# Patient Record
Sex: Female | Born: 1975 | Race: Black or African American | Hispanic: No | Marital: Single | Smoking: Former smoker
Health system: Southern US, Community
[De-identification: ages and names within clinical notes are randomized; demographics above are authoritative.]

## PROBLEM LIST (undated history)

## (undated) DIAGNOSIS — C801 Malignant (primary) neoplasm, unspecified: Secondary | ICD-10-CM

## (undated) DIAGNOSIS — I10 Essential (primary) hypertension: Secondary | ICD-10-CM

## (undated) DIAGNOSIS — E119 Type 2 diabetes mellitus without complications: Secondary | ICD-10-CM

---

## 2006-05-04 ENCOUNTER — Ambulatory Visit: Payer: Self-pay | Admitting: Family Medicine

## 2008-11-12 ENCOUNTER — Emergency Department: Payer: Self-pay | Admitting: Emergency Medicine

## 2010-07-02 ENCOUNTER — Encounter: Payer: Self-pay | Admitting: Obstetrics and Gynecology

## 2010-07-07 ENCOUNTER — Emergency Department: Payer: Self-pay | Admitting: Emergency Medicine

## 2010-07-11 ENCOUNTER — Ambulatory Visit: Payer: Self-pay

## 2010-07-27 ENCOUNTER — Encounter: Payer: Self-pay | Admitting: Obstetrics & Gynecology

## 2013-03-15 ENCOUNTER — Ambulatory Visit: Payer: Self-pay | Admitting: Physical Medicine and Rehabilitation

## 2014-06-15 ENCOUNTER — Emergency Department: Payer: Self-pay | Admitting: Emergency Medicine

## 2014-06-15 LAB — CBC WITH DIFFERENTIAL/PLATELET
Basophil #: 0.1 10*3/uL (ref 0.0–0.1)
Basophil %: 0.7 %
EOS ABS: 0.1 10*3/uL (ref 0.0–0.7)
Eosinophil %: 0.7 %
HCT: 36.8 % (ref 35.0–47.0)
HGB: 12.3 g/dL (ref 12.0–16.0)
LYMPHS PCT: 20.5 %
Lymphocyte #: 1.5 10*3/uL (ref 1.0–3.6)
MCH: 29.6 pg (ref 26.0–34.0)
MCHC: 33.5 g/dL (ref 32.0–36.0)
MCV: 89 fL (ref 80–100)
Monocyte #: 0.5 x10 3/mm (ref 0.2–0.9)
Monocyte %: 6.7 %
NEUTROS PCT: 71.4 %
Neutrophil #: 5.3 10*3/uL (ref 1.4–6.5)
PLATELETS: 308 10*3/uL (ref 150–440)
RBC: 4.16 10*6/uL (ref 3.80–5.20)
RDW: 14 % (ref 11.5–14.5)
WBC: 7.5 10*3/uL (ref 3.6–11.0)

## 2014-06-15 LAB — URINALYSIS, COMPLETE
Specific Gravity: 1.023 (ref 1.003–1.030)
Squamous Epithelial: 2

## 2014-06-15 LAB — COMPREHENSIVE METABOLIC PANEL
ALBUMIN: 4 g/dL (ref 3.4–5.0)
ALK PHOS: 64 U/L (ref 46–116)
ALT: 29 U/L (ref 14–63)
AST: 14 U/L — AB (ref 15–37)
Anion Gap: 10 (ref 7–16)
BUN: 11 mg/dL (ref 7–18)
Bilirubin,Total: 0.4 mg/dL (ref 0.2–1.0)
CALCIUM: 9 mg/dL (ref 8.5–10.1)
CO2: 25 mmol/L (ref 21–32)
Chloride: 105 mmol/L (ref 98–107)
Creatinine: 0.93 mg/dL (ref 0.60–1.30)
EGFR (African American): >60
Glucose: 265 mg/dL — ABNORMAL HIGH (ref 65–99)
OSMOLALITY: 288 (ref 275–301)
Potassium: 3.5 mmol/L (ref 3.5–5.1)
SODIUM: 140 mmol/L (ref 136–145)
Total Protein: 7.7 g/dL (ref 6.4–8.2)

## 2014-06-15 LAB — LIPASE, BLOOD: Lipase: 92 U/L (ref 73–393)

## 2014-06-17 LAB — URINE CULTURE

## 2014-07-02 ENCOUNTER — Ambulatory Visit: Payer: Self-pay

## 2014-09-08 HISTORY — PX: NEPHRECTOMY: SHX65

## 2014-12-13 ENCOUNTER — Other Ambulatory Visit: Payer: Self-pay | Admitting: Family Medicine

## 2014-12-13 DIAGNOSIS — Z1231 Encounter for screening mammogram for malignant neoplasm of breast: Secondary | ICD-10-CM

## 2014-12-26 ENCOUNTER — Ambulatory Visit
Admission: RE | Admit: 2014-12-26 | Discharge: 2014-12-26 | Disposition: A | Discharge status: Home / Self Care | Payer: PRIVATE HEALTH INSURANCE | Source: Ambulatory Visit | Attending: Family Medicine | Admitting: Family Medicine

## 2014-12-26 DIAGNOSIS — Z1231 Encounter for screening mammogram for malignant neoplasm of breast: Secondary | ICD-10-CM | POA: Diagnosis present

## 2014-12-26 HISTORY — DX: Malignant (primary) neoplasm, unspecified: C80.1

## 2015-09-08 IMAGING — CT CT ABDOMEN AND PELVIS WITHOUT AND WITH CONTRAST
2 of 7 series · 13 of 46 positions shown, 18 images · IV contrast (omnipaque)
Comparison: 06/15/2014

CLINICAL DATA: Left renal mass

EXAM:
CT ABDOMEN AND PELVIS WITHOUT AND WITH CONTRAST
TECHNIQUE: Multidetector CT imaging of the abdomen and pelvis was performed
following the standard protocol before and following the bolus
administration of intravenous contrast.
CONTRAST:  150 cc Omnipaque 350

[Series 13: hematuria < 45 wwith · axial · 0.91mm/px · z∈[-1008,-613]mm · 10 of 95 slices shown, 15 images]
[im 8/95  soft-tissue]
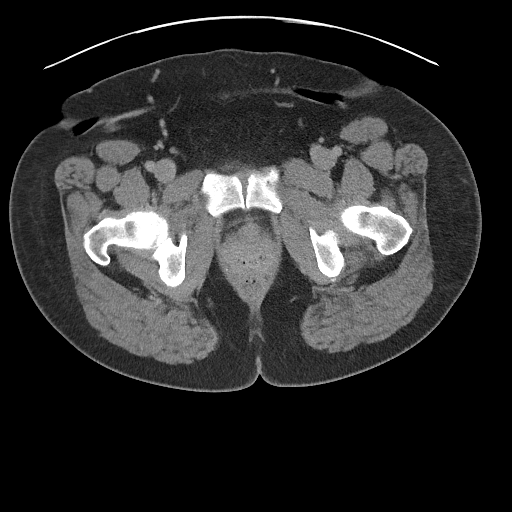
[im 8/95  bone]
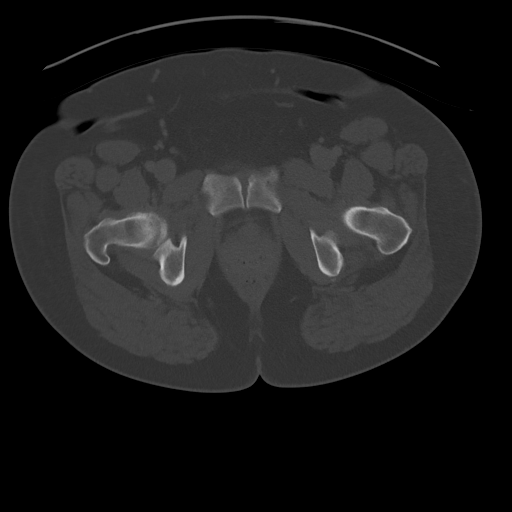
[im 22/95  soft-tissue]
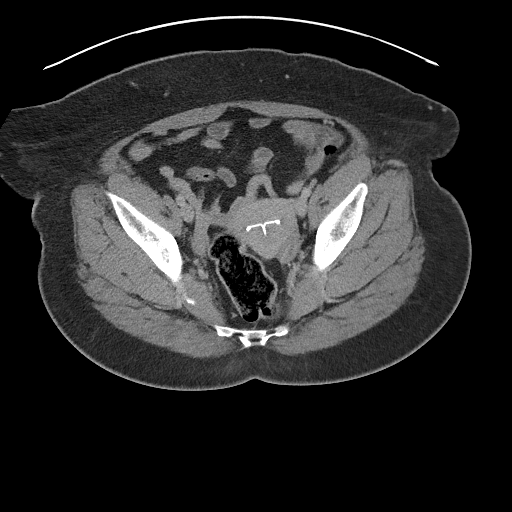
[im 29/95  soft-tissue]
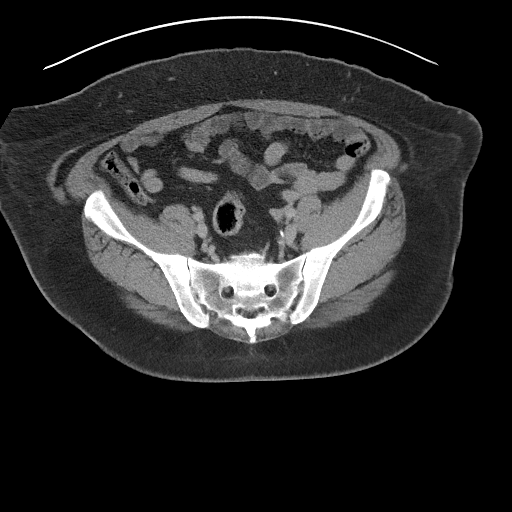
[im 37/95  soft-tissue]
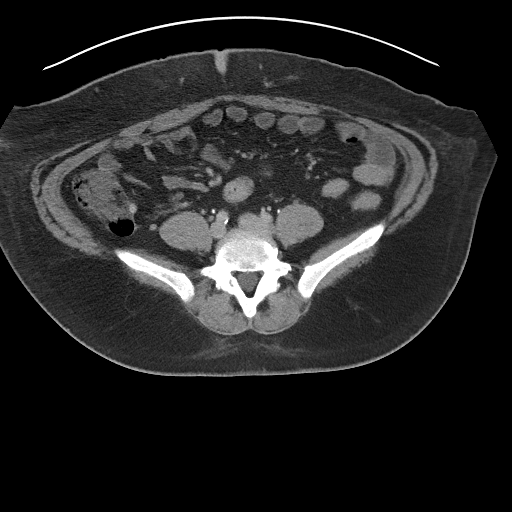
[im 51/95  soft-tissue]
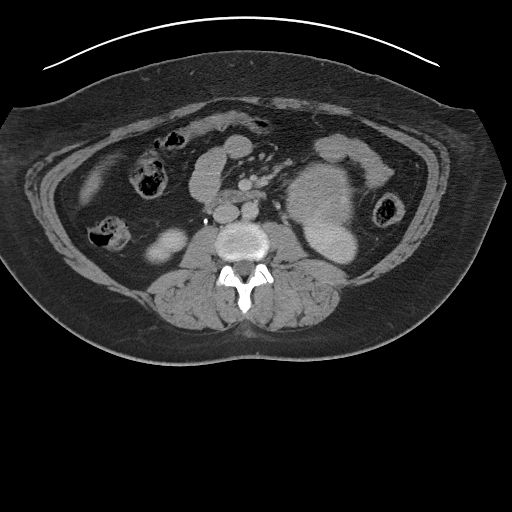
[im 58/95  soft-tissue]
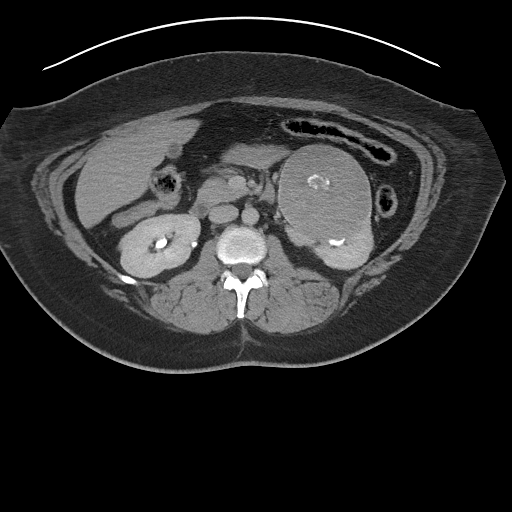
[im 66/95  soft-tissue]
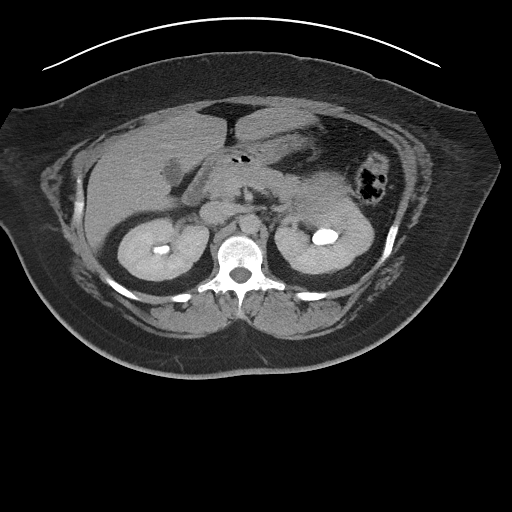
[im 66/95  lung]
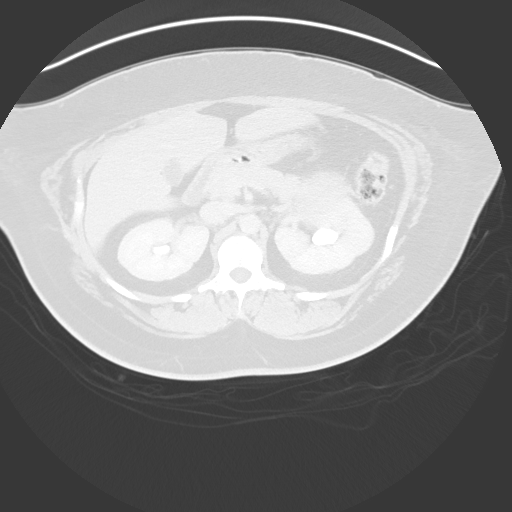
[im 73/95  lung]
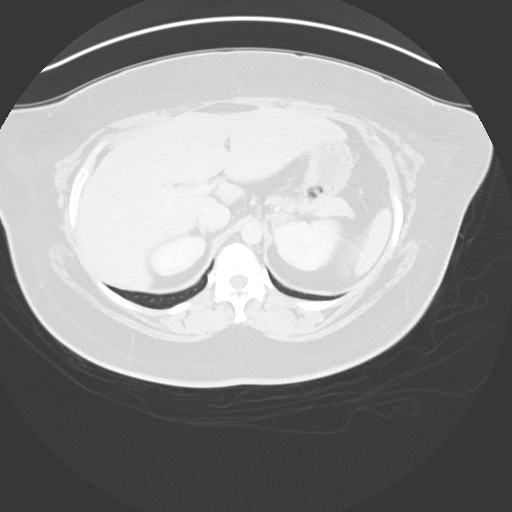
[im 80/95  soft-tissue]
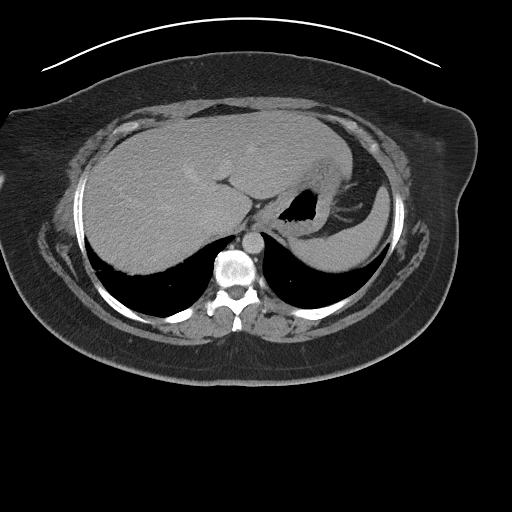
[im 80/95  lung]
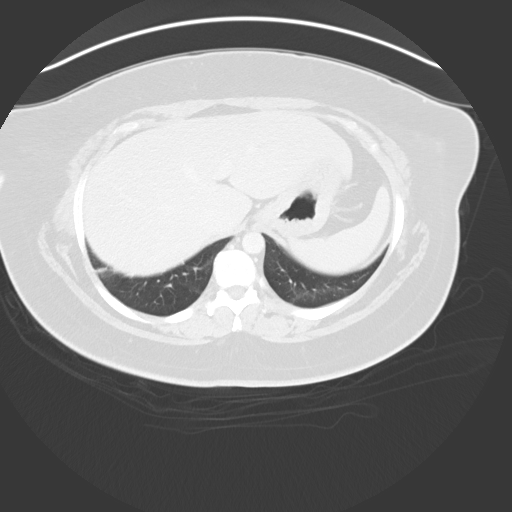
[im 87/95  soft-tissue]
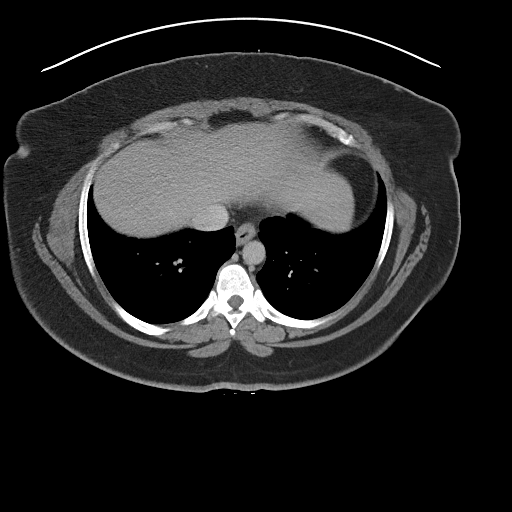
[im 87/95  lung]
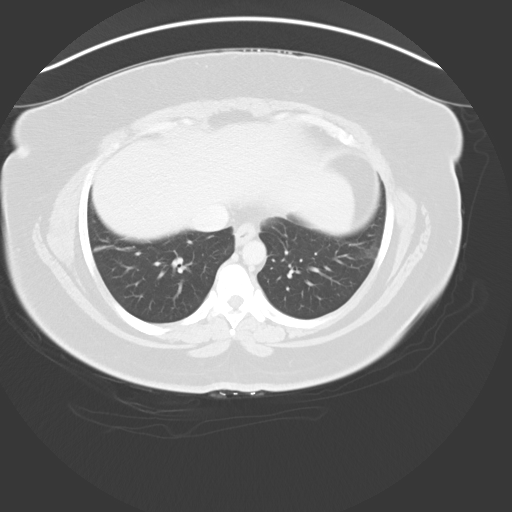
[im 87/95  bone]
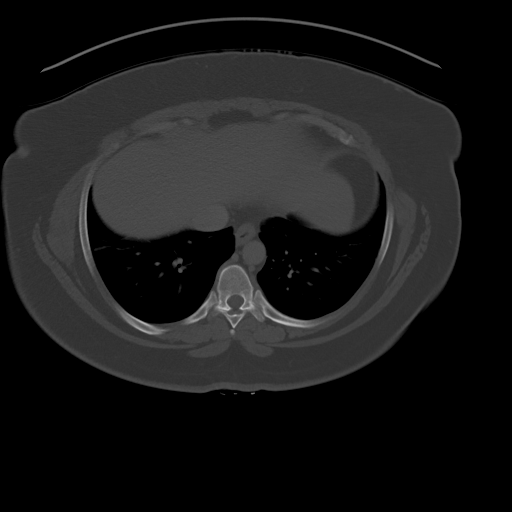

[Series 17: cor hematuria < 45 w. · coronal · 0.86mm/px · 3 of 149 slices shown]
[im 38/149  soft-tissue]
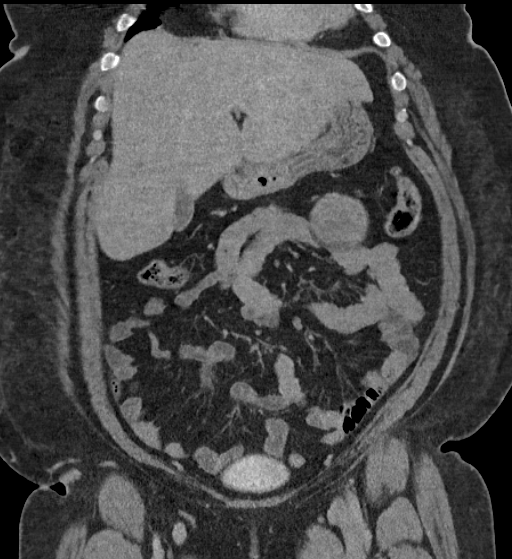
[im 75/149  soft-tissue]
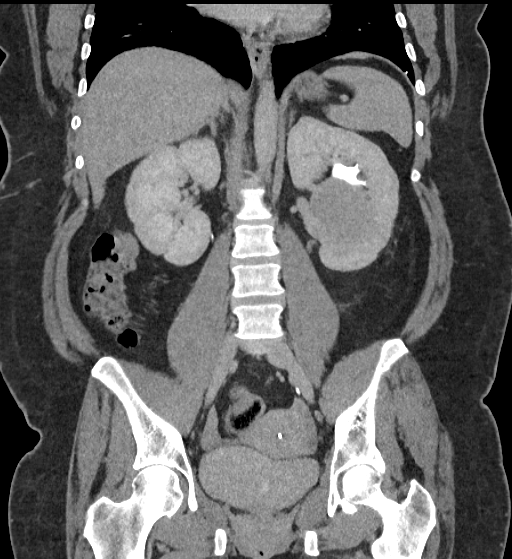
[im 112/149  soft-tissue]
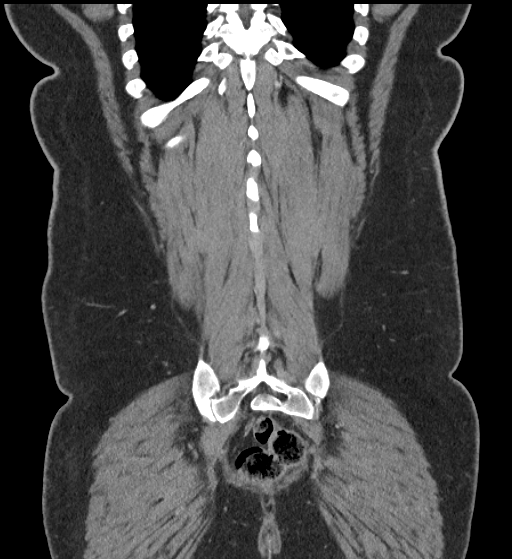

[13 of 46 positions shown; findings below may reference images not displayed]

FINDINGS: Lower chest:  Mild lingular and right lower lobe scarring.

Hepatobiliary: Mildly contracted gallbladder.

Pancreas: Unremarkable

Spleen: Unremarkable

Adrenals/Urinary Tract: 9.2 by 8.5 cm exophytic mass of the left mid
kidney anteriorly with a 2.5 by 1.5 cm central region of
calcification along a central scar, and moderate spoke wheel
enhancement in the rest of the mass. The calcification is in the
vicinity of a hypoenhancing central scar. The mass partially
compresses the left renal pelvis, especially in the lower pole, and
posteriorly displaces the left ureter. I do not observe tumor
thrombus in the left renal vein although the left renal vein is
posteriorly displaced by the mass.

No other parenchymal masses are identified. No urinary tract calculi
seen. Aside from the extrinsic compression of the left collecting
system, I do not see a filling defect along the urothelium, although
portions of both ureters do not fill with contrast.

Stomach/Bowel: Unremarkable

Vascular/Lymphatic: Mild bilateral common iliac artery
atherosclerotic calcification. Small gastrohepatic ligament and
retroperitoneal lymph nodes are not pathologically enlarged.
Similarly a portacaval lymph node is not pathologically enlarged.
Small bilateral pelvic lymph nodes are not pathologically enlarged.

Reproductive: IUD satisfactorily positioned in the uterus.

Other: No supplemental non-categorized findings.

Musculoskeletal: Facet arthropathy at L4-5 with grade 1
anterolisthesis at this level.
IMPRESSION: 1. 9.2 cm exophytic mass the left mid kidney with spoke wheel
enhancement, a hypoenhancing central scar, and central
calcification. Although some of these characteristics are well
described in the setting of renal oncocytoma, a renal cell carcinoma
can appear identically, and this should be assumed to represent a
renal cell carcinoma until proven otherwise.

## 2015-10-10 ENCOUNTER — Encounter: Payer: Self-pay | Admitting: *Deleted

## 2015-10-10 ENCOUNTER — Other Ambulatory Visit: Payer: PRIVATE HEALTH INSURANCE

## 2015-10-10 NOTE — Patient Instructions (Signed)
  Your procedure is scheduled on: 10-17-15 (FRIDAY) Report to [place] To find out your arrival time please call [phone] between 1PM - 3PM on 10-16-15 (THURSDAY)  Remember: Instructions that are not followed completely may result in serious medical risk, up to and including death, or upon the discretion of your surgeon and anesthesiologist your surgery may need to be rescheduled.    _X___ 1. Do not eat food or drink liquids after midnight. No gum chewing or hard candies.     _X___ 2. No Alcohol for 24 hours before or after surgery.   ____ 3. Bring all medications with you on the day of surgery if instructed.    _X___ 4. Notify your doctor if there is any change in your medical condition     (cold, fever, infections).     Do not wear jewelry, make-up, hairpins, clips or nail polish.  Do not wear lotions, powders, or perfumes. You may wear deodorant.  Do not shave 48 hours prior to surgery. Men may shave face and neck.  Do not bring valuables to the hospital.    [facility] is not responsible for any belongings or valuables.               Contacts, dentures or bridgework may not be worn into surgery.  Leave your suitcase in the car. After surgery it may be brought to your room.  For patients admitted to the hospital, discharge time is determined by your treatment team.   Patients discharged the day of surgery will not be allowed to drive home.   Please read over the following fact sheets that you were given:     _X___ Take these medicines the morning of surgery with A SIP OF WATER:    1.CARTIA  2.LISINOPRIL  3.LIPITOR  4.  5.  6.  ____ Fleet Enema (as directed)   ____ Use CHG Soap as directed  ____ Use inhalers on the day of surgery  _X___ Stop metformin 2 days prior to surgery-LAST DOSE ON Tuesday, June 6TH    ____ Take 1/2 of usual insulin dose the night before surgery and none on the morning of surgery.   ____ Stop  Coumadin/Plavix/aspirin-N/A  _X___ Stop Anti-inflammatories-NO NSAIDS OR ASPIRIN PRODUCTS-TYLENOL OK TO TAKE   ____ Stop supplements until after surgery.    ____ Bring C-Pap to the hospital.

## 2015-10-15 ENCOUNTER — Encounter
Admission: RE | Admit: 2015-10-15 | Discharge: 2015-10-15 | Disposition: A | Discharge status: Home / Self Care | Payer: BLUE CROSS/BLUE SHIELD | Source: Ambulatory Visit | Attending: Obstetrics and Gynecology | Admitting: Obstetrics and Gynecology

## 2015-10-15 ENCOUNTER — Other Ambulatory Visit: Payer: Self-pay

## 2015-10-15 DIAGNOSIS — E119 Type 2 diabetes mellitus without complications: Secondary | ICD-10-CM | POA: Diagnosis not present

## 2015-10-15 DIAGNOSIS — I10 Essential (primary) hypertension: Secondary | ICD-10-CM | POA: Diagnosis not present

## 2015-10-15 DIAGNOSIS — Z87891 Personal history of nicotine dependence: Secondary | ICD-10-CM | POA: Diagnosis not present

## 2015-10-15 DIAGNOSIS — Z30432 Encounter for removal of intrauterine contraceptive device: Secondary | ICD-10-CM | POA: Diagnosis present

## 2015-10-15 DIAGNOSIS — Z85528 Personal history of other malignant neoplasm of kidney: Secondary | ICD-10-CM | POA: Diagnosis not present

## 2015-10-15 LAB — BASIC METABOLIC PANEL
ANION GAP: 7 (ref 5–15)
BUN: 14 mg/dL (ref 6–20)
CHLORIDE: 105 mmol/L (ref 101–111)
CO2: 26 mmol/L (ref 22–32)
Calcium: 9 mg/dL (ref 8.9–10.3)
Creatinine, Ser: 0.96 mg/dL (ref 0.44–1.00)
GFR calc non Af Amer: >60 mL/min (ref >60)
GLUCOSE: 131 mg/dL — AB (ref 65–99)
Potassium: 3.6 mmol/L (ref 3.5–5.1)
Sodium: 138 mmol/L (ref 135–145)

## 2015-10-15 LAB — CBC
HEMATOCRIT: 35.2 % (ref 35.0–47.0)
HEMOGLOBIN: 11.8 g/dL — AB (ref 12.0–16.0)
MCH: 29.9 pg (ref 26.0–34.0)
MCHC: 33.5 g/dL (ref 32.0–36.0)
MCV: 89.2 fL (ref 80.0–100.0)
Platelets: 264 10*3/uL (ref 150–440)
RBC: 3.95 MIL/uL (ref 3.80–5.20)
RDW: 14.1 % (ref 11.5–14.5)
WBC: 5.9 10*3/uL (ref 3.6–11.0)

## 2015-10-17 ENCOUNTER — Ambulatory Visit: Payer: BLUE CROSS/BLUE SHIELD | Admitting: Anesthesiology

## 2015-10-17 ENCOUNTER — Ambulatory Visit
Admission: RE | Admit: 2015-10-17 | Discharge: 2015-10-17 | Disposition: A | Discharge status: Home / Self Care | Payer: BLUE CROSS/BLUE SHIELD | Source: Ambulatory Visit | Attending: Obstetrics and Gynecology | Admitting: Obstetrics and Gynecology

## 2015-10-17 ENCOUNTER — Encounter
Admission: RE | Disposition: A | Discharge status: Home / Self Care | Payer: Self-pay | Source: Ambulatory Visit | Attending: Obstetrics and Gynecology

## 2015-10-17 ENCOUNTER — Encounter: Payer: Self-pay | Admitting: *Deleted

## 2015-10-17 DIAGNOSIS — Z87891 Personal history of nicotine dependence: Secondary | ICD-10-CM | POA: Insufficient documentation

## 2015-10-17 DIAGNOSIS — I10 Essential (primary) hypertension: Secondary | ICD-10-CM | POA: Insufficient documentation

## 2015-10-17 DIAGNOSIS — Z85528 Personal history of other malignant neoplasm of kidney: Secondary | ICD-10-CM | POA: Insufficient documentation

## 2015-10-17 DIAGNOSIS — Z30432 Encounter for removal of intrauterine contraceptive device: Secondary | ICD-10-CM | POA: Diagnosis not present

## 2015-10-17 DIAGNOSIS — E119 Type 2 diabetes mellitus without complications: Secondary | ICD-10-CM | POA: Insufficient documentation

## 2015-10-17 HISTORY — PX: DILATATION & CURETTAGE/HYSTEROSCOPY WITH MYOSURE: SHX6511

## 2015-10-17 HISTORY — DX: Essential (primary) hypertension: I10

## 2015-10-17 HISTORY — DX: Type 2 diabetes mellitus without complications: E11.9

## 2015-10-17 LAB — POCT PREGNANCY, URINE: Preg Test, Ur: NEGATIVE

## 2015-10-17 LAB — GLUCOSE, CAPILLARY
GLUCOSE-CAPILLARY: 109 mg/dL — AB (ref 65–99)
Glucose-Capillary: 117 mg/dL — ABNORMAL HIGH (ref 65–99)

## 2015-10-17 SURGERY — DILATATION & CURETTAGE/HYSTEROSCOPY WITH MYOSURE
Anesthesia: General | Wound class: Clean Contaminated

## 2015-10-17 MED ORDER — FAMOTIDINE 20 MG PO TABS
ORAL_TABLET | ORAL | Status: AC
  Administered 2015-10-17: 20 mg via ORAL
  Filled 2015-10-17: qty 1

## 2015-10-17 MED ORDER — FAMOTIDINE 20 MG PO TABS
20.0000 mg | ORAL_TABLET | Freq: Once | ORAL | Status: AC
  Administered 2015-10-17: 20 mg via ORAL

## 2015-10-17 MED ORDER — FENTANYL CITRATE (PF) 100 MCG/2ML IJ SOLN
INTRAMUSCULAR | Status: DC | PRN
  Administered 2015-10-17 (×2): 25 ug via INTRAVENOUS
  Administered 2015-10-17: 50 ug via INTRAVENOUS

## 2015-10-17 MED ORDER — ONDANSETRON HCL 4 MG/2ML IJ SOLN: 4.0000 mg | Freq: Once | INTRAMUSCULAR | Status: DC | PRN

## 2015-10-17 MED ORDER — IBUPROFEN 600 MG PO TABS: 600.0000 mg | ORAL_TABLET | Freq: Four times a day (QID) | ORAL | Status: AC | PRN

## 2015-10-17 MED ORDER — SODIUM CHLORIDE 0.9 % IV SOLN
INTRAVENOUS | Status: DC
  Administered 2015-10-17: 13:00:00 via INTRAVENOUS

## 2015-10-17 MED ORDER — HYDROCODONE-ACETAMINOPHEN 5-325 MG PO TABS: 1.0000 | ORAL_TABLET | Freq: Four times a day (QID) | ORAL | Status: AC | PRN

## 2015-10-17 MED ORDER — FENTANYL CITRATE (PF) 100 MCG/2ML IJ SOLN
25.0000 ug | INTRAMUSCULAR | Status: DC | PRN
  Administered 2015-10-17 (×4): 25 ug via INTRAVENOUS

## 2015-10-17 MED ORDER — ONDANSETRON HCL 4 MG/2ML IJ SOLN
INTRAMUSCULAR | Status: DC | PRN
  Administered 2015-10-17: 4 mg via INTRAVENOUS

## 2015-10-17 MED ORDER — FENTANYL CITRATE (PF) 100 MCG/2ML IJ SOLN
INTRAMUSCULAR | Status: AC
  Administered 2015-10-17: 25 ug via INTRAVENOUS
  Filled 2015-10-17: qty 2

## 2015-10-17 MED ORDER — LIDOCAINE HCL (CARDIAC) 20 MG/ML IV SOLN
INTRAVENOUS | Status: DC | PRN
  Administered 2015-10-17: 60 mg via INTRAVENOUS

## 2015-10-17 MED ORDER — PROPOFOL 10 MG/ML IV BOLUS
INTRAVENOUS | Status: DC | PRN
  Administered 2015-10-17: 200 mg via INTRAVENOUS

## 2015-10-17 MED ORDER — MIDAZOLAM HCL 5 MG/5ML IJ SOLN
INTRAMUSCULAR | Status: DC | PRN
  Administered 2015-10-17: 2 mg via INTRAVENOUS

## 2015-10-17 MED ORDER — DEXAMETHASONE SODIUM PHOSPHATE 10 MG/ML IJ SOLN
INTRAMUSCULAR | Status: DC | PRN
  Administered 2015-10-17: 5 mg via INTRAVENOUS

## 2015-10-17 SURGICAL SUPPLY — 20 items
ABLATOR ENDOMETRIAL MYOSURE (ABLATOR) ×3 IMPLANT
CANISTER SUC SOCK COL 7IN (MISCELLANEOUS) ×3 IMPLANT
CATH ROBINSON RED A/P 16FR (CATHETERS) ×3 IMPLANT
ELECT REM PT RETURN 9FT ADLT (ELECTROSURGICAL) ×3
ELECTRODE REM PT RTRN 9FT ADLT (ELECTROSURGICAL) ×1 IMPLANT
GLOVE BIO SURGEON STRL SZ7 (GLOVE) ×3 IMPLANT
GOWN STRL REUS W/ TWL LRG LVL3 (GOWN DISPOSABLE) ×2 IMPLANT
GOWN STRL REUS W/TWL LRG LVL3 (GOWN DISPOSABLE) ×4
KIT RM TURNOVER CYSTO AR (KITS) ×3 IMPLANT
MYOSURE LITE POLYP REMOVAL (MISCELLANEOUS) IMPLANT
PACK DNC HYST (MISCELLANEOUS) ×3 IMPLANT
PAD OB MATERNITY 4.3X12.25 (PERSONAL CARE ITEMS) ×3 IMPLANT
PAD PREP 24X41 OB/GYN DISP (PERSONAL CARE ITEMS) ×3 IMPLANT
SEAL ROD LENS SCOPE MYOSURE (ABLATOR) ×3 IMPLANT
SOL .9 NS 3000ML IRR  AL (IV SOLUTION) ×4
SOL .9 NS 3000ML IRR UROMATIC (IV SOLUTION) ×2 IMPLANT
TOWEL OR 17X26 4PK STRL BLUE (TOWEL DISPOSABLE) ×3 IMPLANT
TUBING CONNECTING 10 (TUBING) ×2 IMPLANT
TUBING CONNECTING 10' (TUBING) ×1
TUBING HYSTEROSCOPY DOLPHIN (MISCELLANEOUS) ×3 IMPLANT

## 2015-10-17 NOTE — H&P (Signed)
Initial paper H&P examined  History reviewed, patient examined, no change in status, stable for surgery.

## 2015-10-17 NOTE — Anesthesia Preprocedure Evaluation (Signed)
Anesthesia Evaluation  Patient identified by MRN, date of birth, ID band Patient awake    Reviewed: Allergy & Precautions, H&P , NPO status , Patient's Chart, lab work & pertinent test results, reviewed documented beta blocker date and time   History of Anesthesia Complications Negative for: history of anesthetic complications  Airway Mallampati: III  TM Distance: >3 FB Neck ROM: full    Dental no notable dental hx. (+) Teeth Intact   Pulmonary neg pulmonary ROS, former smoker,    Pulmonary exam normal breath sounds clear to auscultation       Cardiovascular Exercise Tolerance: Good hypertension, (-) angina(-) CAD, (-) Past MI, (-) Cardiac Stents and (-) CABG Normal cardiovascular exam(-) dysrhythmias (-) Valvular Problems/Murmurs Rhythm:regular Rate:Normal     Neuro/Psych negative neurological ROS  negative psych ROS   GI/Hepatic negative GI ROS, Neg liver ROS,   Endo/Other  diabetes, Oral Hypoglycemic AgentsMorbid obesity  Renal/GU Renal disease (s/p left nephrectomy for cancer)  negative genitourinary   Musculoskeletal   Abdominal   Peds  Hematology negative hematology ROS (+)   Anesthesia Other Findings Past Medical History:   Hypertension                                                 Diabetes mellitus without complication ([place])                 Cancer ([place])                                                   Comment:kidney   Reproductive/Obstetrics negative OB ROS                             Anesthesia Physical Anesthesia Plan  ASA: III  Anesthesia Plan: General   Post-op Pain Management:    Induction:   Airway Management Planned:   Additional Equipment:   Intra-op Plan:   Post-operative Plan:   Informed Consent: I have reviewed the patients History and Physical, chart, labs and discussed the procedure including the risks, benefits and alternatives for the proposed  anesthesia with the patient or authorized representative who has indicated his/her understanding and acceptance.   Dental Advisory Given  Plan Discussed with: Anesthesiologist, CRNA and Surgeon  Anesthesia Plan Comments:         Anesthesia Quick Evaluation

## 2015-10-17 NOTE — Transfer of Care (Signed)
Immediate Anesthesia Transfer of Care Note  Patient: Kelly Bowman  Procedure(s) Performed: Procedure(s): HYSTEROSCOPY WITH IUD REMOVAL (N/A)  Patient Location: PACU  Anesthesia Type:General  Level of Consciousness: awake, alert  and responds to stimulation  Airway & Oxygen Therapy: Patient Spontanous Breathing and Patient connected to face mask oxygen  Post-op Assessment: Report given to RN and Post -op Vital signs reviewed and stable  Post vital signs: Reviewed and stable  Last Vitals:  Filed Vitals:   10/17/15 1230 10/17/15 1432  BP: 143/93 129/83  Pulse: 81 77  Temp: 36.7 C 36 C  Resp: 18 20    Last Pain: There were no vitals filed for this visit.       Complications: No apparent anesthesia complications

## 2015-10-17 NOTE — Op Note (Signed)
Patient Name: Kelly Bowman Date of Procedure: 10/17/2015  Preoperative Diagnosis: 1) 40 y.o. with retained Mirena IUD  Postoperative Diagnosis: 1) 40 y.o. with retained Mirena IUD  Operation Performed: Hysteroscopy, removal of Mirena IUD  Indication: Retained IUD desires removal, string not visualized and attempts at in office removal unsuccessfull   Anesthesia: .General  Primary Surgeon: Malachy Mood, MD  Assistant: none  Preoperative Antibiotics: none  Estimated Blood Loss: minimal  IV Fluids: 522mL  Urine Output:: ~76mL straiggt cath  Drains or Tubes: none  Implants: none  Specimens Removed: Mirena IUD  Complications: none  Intraoperative Findings:  Normal cervix no IUD strings visualized.  Hysteroscopy revealed the IUD in proper location within the cavity and arms deployed correctly.  Strings were measured at only 1.3cm in length  Patient Condition: stable  Procedure in Detail:  Patient was taken to the operating room were she was administered general endotracheal anesthesia.  She was positioned in the dorsal lithotomy position utilizing Allen stirups, prepped and draped in the usual sterile fashion.  Uterus was noted to be non-enlarged in size, anteverted   Prior to proceeding with the case a time out was performed.  Attention was turned to the patient's pelvis.  A red rubber catheter was used to empty the patient's bladder.  An operative speculum was placed to allow visualization of the cervix.  The anterior lip of the cervix was grasped with a single tooth tenaculum and the cervix was sequentially dilated using pratt dilators.  The hysteroscope was then advanced into the uterine cavity noting the above findings.  The IUD was able to be removed using a uterine packing forceps. The single tooth tenaculum was removed from the cervix.  The tenaculum sites and cervix were noted to be  Hemostatic before removing the operative speculum.  Sponge needle and instrument counts  were corrects times two.  The patient tolerated the procedure well and was taken to the recovery room in stable condition.

## 2015-10-17 NOTE — Discharge Instructions (Signed)

## 2015-10-17 NOTE — Anesthesia Procedure Notes (Signed)
Procedure Name: LMA Insertion Date/Time: 10/17/2015 2:11 PM Performed by: Dionne Bucy Pre-anesthesia Checklist: Patient identified, Patient being monitored, Timeout performed, Emergency Drugs available and Suction available Patient Re-evaluated:Patient Re-evaluated prior to inductionOxygen Delivery Method: Circle system utilized Preoxygenation: Pre-oxygenation with 100% oxygen Intubation Type: IV induction Ventilation: Mask ventilation without difficulty LMA: LMA inserted LMA Size: 4.0 Tube type: Oral Number of attempts: 1 Placement Confirmation: positive ETCO2 and breath sounds checked- equal and bilateral Tube secured with: Tape Dental Injury: Teeth and Oropharynx as per pre-operative assessment

## 2015-10-18 NOTE — Anesthesia Postprocedure Evaluation (Signed)
Anesthesia Post Note  Patient: Kelly Bowman  Procedure(s) Performed: Procedure(s) (LRB): HYSTEROSCOPY WITH IUD REMOVAL (N/A)  Patient location during evaluation: Endoscopy Anesthesia Type: General Level of consciousness: awake and alert Pain management: pain level controlled Vital Signs Assessment: post-procedure vital signs reviewed and stable Respiratory status: spontaneous breathing, nonlabored ventilation, respiratory function stable and patient connected to nasal cannula oxygen Cardiovascular status: blood pressure returned to baseline and stable Postop Assessment: no signs of nausea or vomiting Anesthetic complications: no    Last Vitals:  Filed Vitals:   10/17/15 1517 10/17/15 1532  BP: 121/78 125/84  Pulse: 74 80  Temp: 37.2 C 36.4 C  Resp: 13 14    Last Pain:  Filed Vitals:   10/17/15 1533  PainSc: 5                  Martha Clan

## 2015-10-20 ENCOUNTER — Encounter: Payer: Self-pay | Admitting: Obstetrics and Gynecology

## 2016-02-18 ENCOUNTER — Other Ambulatory Visit: Payer: Self-pay | Admitting: Family Medicine

## 2016-02-18 DIAGNOSIS — Z1231 Encounter for screening mammogram for malignant neoplasm of breast: Secondary | ICD-10-CM

## 2016-02-24 ENCOUNTER — Ambulatory Visit
Admission: RE | Admit: 2016-02-24 | Discharge: 2016-02-24 | Disposition: A | Discharge status: Home / Self Care | Payer: BLUE CROSS/BLUE SHIELD | Source: Ambulatory Visit | Attending: Family Medicine | Admitting: Family Medicine

## 2016-02-24 DIAGNOSIS — Z1231 Encounter for screening mammogram for malignant neoplasm of breast: Secondary | ICD-10-CM | POA: Diagnosis not present

## 2016-10-27 ENCOUNTER — Other Ambulatory Visit: Payer: Self-pay | Admitting: Obstetrics and Gynecology

## 2017-01-30 ENCOUNTER — Other Ambulatory Visit: Payer: Self-pay | Admitting: Obstetrics and Gynecology

## 2017-01-31 ENCOUNTER — Other Ambulatory Visit: Payer: Self-pay

## 2017-01-31 ENCOUNTER — Other Ambulatory Visit: Payer: Self-pay | Admitting: Obstetrics and Gynecology

## 2017-01-31 ENCOUNTER — Telehealth: Payer: Self-pay | Admitting: Obstetrics and Gynecology

## 2017-01-31 MED ORDER — NORETHINDRONE ACETATE 5 MG PO TABS: 5.0000 mg | ORAL_TABLET | Freq: Every day | ORAL | 11 refills | Status: AC

## 2017-01-31 NOTE — Telephone Encounter (Signed)
NOREPHINDRONE 5MG 

## 2017-01-31 NOTE — Telephone Encounter (Signed)
I do not see a current birthcontrol on her medication list. I called her and left her a message to call back and let the receptionist know the name and we would call in a refill for her. KJ CMA

## 2017-01-31 NOTE — Telephone Encounter (Signed)
Please advise 

## 2017-01-31 NOTE — Telephone Encounter (Signed)
Has been called in to her pharmacy

## 2017-01-31 NOTE — Telephone Encounter (Signed)
Patient needs refill on bc asap, annual scheduled 10/10 with [facility].  [employer].

## 2017-02-01 NOTE — Telephone Encounter (Signed)
Pt aware of Rx being called in [facility])

## 2017-02-16 ENCOUNTER — Ambulatory Visit: Payer: Self-pay | Admitting: Obstetrics and Gynecology

## 2017-08-24 ENCOUNTER — Other Ambulatory Visit: Payer: Self-pay | Admitting: Family Medicine

## 2017-08-24 DIAGNOSIS — Z1231 Encounter for screening mammogram for malignant neoplasm of breast: Secondary | ICD-10-CM

## 2017-08-31 ENCOUNTER — Ambulatory Visit
Admission: RE | Admit: 2017-08-31 | Discharge: 2017-08-31 | Disposition: A | Discharge status: Home / Self Care | Payer: BLUE CROSS/BLUE SHIELD | Source: Ambulatory Visit | Attending: Family Medicine | Admitting: Family Medicine

## 2017-08-31 DIAGNOSIS — Z1231 Encounter for screening mammogram for malignant neoplasm of breast: Secondary | ICD-10-CM | POA: Insufficient documentation

## 2019-09-25 ENCOUNTER — Other Ambulatory Visit: Payer: Self-pay | Admitting: Family Medicine

## 2019-09-25 DIAGNOSIS — Z1231 Encounter for screening mammogram for malignant neoplasm of breast: Secondary | ICD-10-CM

## 2019-10-10 ENCOUNTER — Ambulatory Visit
Admission: RE | Admit: 2019-10-10 | Discharge: 2019-10-10 | Disposition: A | Discharge status: Home / Self Care | Payer: BLUE CROSS/BLUE SHIELD | Source: Ambulatory Visit | Attending: Family Medicine | Admitting: Family Medicine

## 2019-10-10 DIAGNOSIS — Z1231 Encounter for screening mammogram for malignant neoplasm of breast: Secondary | ICD-10-CM

## 2020-10-08 ENCOUNTER — Other Ambulatory Visit: Payer: Self-pay | Admitting: Family Medicine

## 2020-10-08 DIAGNOSIS — Z1231 Encounter for screening mammogram for malignant neoplasm of breast: Secondary | ICD-10-CM

## 2020-10-10 ENCOUNTER — Other Ambulatory Visit: Payer: Self-pay

## 2020-10-10 ENCOUNTER — Ambulatory Visit
Admission: RE | Admit: 2020-10-10 | Discharge: 2020-10-10 | Disposition: A | Discharge status: Home / Self Care | Payer: BC Managed Care – PPO | Source: Ambulatory Visit | Attending: Family Medicine | Admitting: Family Medicine

## 2020-10-10 DIAGNOSIS — Z1231 Encounter for screening mammogram for malignant neoplasm of breast: Secondary | ICD-10-CM

## 2021-10-12 ENCOUNTER — Other Ambulatory Visit: Payer: Self-pay | Admitting: Family Medicine

## 2021-10-12 DIAGNOSIS — Z1231 Encounter for screening mammogram for malignant neoplasm of breast: Secondary | ICD-10-CM

## 2021-11-11 ENCOUNTER — Ambulatory Visit
Admission: RE | Admit: 2021-11-11 | Discharge: 2021-11-11 | Disposition: A | Discharge status: Home / Self Care | Payer: BC Managed Care – PPO | Source: Ambulatory Visit | Attending: Family Medicine | Admitting: Family Medicine

## 2021-11-11 DIAGNOSIS — Z1231 Encounter for screening mammogram for malignant neoplasm of breast: Secondary | ICD-10-CM | POA: Insufficient documentation

## 2022-11-08 ENCOUNTER — Other Ambulatory Visit: Payer: Self-pay | Admitting: Family Medicine

## 2022-11-08 DIAGNOSIS — Z1231 Encounter for screening mammogram for malignant neoplasm of breast: Secondary | ICD-10-CM

## 2022-11-16 ENCOUNTER — Ambulatory Visit
Admission: RE | Admit: 2022-11-16 | Discharge: 2022-11-16 | Disposition: A | Discharge status: Home / Self Care | Payer: BC Managed Care – PPO | Source: Ambulatory Visit | Attending: Family Medicine | Admitting: Family Medicine

## 2022-11-16 DIAGNOSIS — Z1231 Encounter for screening mammogram for malignant neoplasm of breast: Secondary | ICD-10-CM | POA: Diagnosis present

## 2023-10-18 ENCOUNTER — Other Ambulatory Visit: Payer: Self-pay | Admitting: Family Medicine

## 2023-10-18 DIAGNOSIS — Z1231 Encounter for screening mammogram for malignant neoplasm of breast: Secondary | ICD-10-CM

## 2023-11-18 ENCOUNTER — Ambulatory Visit
Admission: RE | Admit: 2023-11-18 | Discharge: 2023-11-18 | Disposition: A | Discharge status: Home / Self Care | Source: Ambulatory Visit | Attending: Family Medicine | Admitting: Family Medicine

## 2023-11-18 DIAGNOSIS — Z1231 Encounter for screening mammogram for malignant neoplasm of breast: Secondary | ICD-10-CM | POA: Insufficient documentation
